# Patient Record
Sex: Female | Born: 1955 | Race: White | Hispanic: No | Marital: Married
Health system: Southern US, Community
[De-identification: ages and names within clinical notes are randomized; demographics above are authoritative.]

## PROBLEM LIST (undated history)

## (undated) DIAGNOSIS — E119 Type 2 diabetes mellitus without complications: Secondary | ICD-10-CM

---

## 2011-07-10 DIAGNOSIS — E559 Vitamin D deficiency, unspecified: Secondary | ICD-10-CM | POA: Insufficient documentation

## 2011-10-09 DIAGNOSIS — G5600 Carpal tunnel syndrome, unspecified upper limb: Secondary | ICD-10-CM | POA: Insufficient documentation

## 2011-12-03 DIAGNOSIS — R011 Cardiac murmur, unspecified: Secondary | ICD-10-CM | POA: Insufficient documentation

## 2013-02-26 DIAGNOSIS — M25569 Pain in unspecified knee: Secondary | ICD-10-CM | POA: Insufficient documentation

## 2015-09-04 DIAGNOSIS — E782 Mixed hyperlipidemia: Secondary | ICD-10-CM | POA: Insufficient documentation

## 2015-09-04 DIAGNOSIS — I10 Essential (primary) hypertension: Secondary | ICD-10-CM | POA: Insufficient documentation

## 2015-09-07 DIAGNOSIS — G629 Polyneuropathy, unspecified: Secondary | ICD-10-CM | POA: Insufficient documentation

## 2015-09-07 DIAGNOSIS — M059 Rheumatoid arthritis with rheumatoid factor, unspecified: Secondary | ICD-10-CM | POA: Insufficient documentation

## 2015-09-07 DIAGNOSIS — G4733 Obstructive sleep apnea (adult) (pediatric): Secondary | ICD-10-CM | POA: Insufficient documentation

## 2015-09-07 DIAGNOSIS — F17200 Nicotine dependence, unspecified, uncomplicated: Secondary | ICD-10-CM | POA: Insufficient documentation

## 2015-09-07 DIAGNOSIS — J301 Allergic rhinitis due to pollen: Secondary | ICD-10-CM | POA: Insufficient documentation

## 2016-02-26 DIAGNOSIS — E041 Nontoxic single thyroid nodule: Secondary | ICD-10-CM | POA: Insufficient documentation

## 2016-03-18 DIAGNOSIS — M2550 Pain in unspecified joint: Secondary | ICD-10-CM | POA: Insufficient documentation

## 2016-08-05 DIAGNOSIS — M179 Osteoarthritis of knee, unspecified: Secondary | ICD-10-CM | POA: Insufficient documentation

## 2019-11-16 DIAGNOSIS — M5412 Radiculopathy, cervical region: Secondary | ICD-10-CM | POA: Insufficient documentation

## 2019-11-16 DIAGNOSIS — S43429A Sprain of unspecified rotator cuff capsule, initial encounter: Secondary | ICD-10-CM | POA: Insufficient documentation

## 2019-11-16 DIAGNOSIS — M5030 Other cervical disc degeneration, unspecified cervical region: Secondary | ICD-10-CM | POA: Insufficient documentation

## 2019-11-16 DIAGNOSIS — E119 Type 2 diabetes mellitus without complications: Secondary | ICD-10-CM | POA: Insufficient documentation

## 2019-11-19 DIAGNOSIS — N390 Urinary tract infection, site not specified: Secondary | ICD-10-CM | POA: Insufficient documentation

## 2019-12-31 DIAGNOSIS — F4323 Adjustment disorder with mixed anxiety and depressed mood: Secondary | ICD-10-CM | POA: Insufficient documentation

## 2020-02-22 DIAGNOSIS — F325 Major depressive disorder, single episode, in full remission: Secondary | ICD-10-CM | POA: Insufficient documentation

## 2020-04-10 DIAGNOSIS — R8281 Pyuria: Secondary | ICD-10-CM | POA: Insufficient documentation

## 2020-11-11 DIAGNOSIS — M1990 Unspecified osteoarthritis, unspecified site: Secondary | ICD-10-CM | POA: Insufficient documentation

## 2021-01-23 ENCOUNTER — Other Ambulatory Visit: Payer: Self-pay | Admitting: Endocrinology

## 2021-01-23 DIAGNOSIS — Z1231 Encounter for screening mammogram for malignant neoplasm of breast: Secondary | ICD-10-CM

## 2021-03-18 ENCOUNTER — Other Ambulatory Visit: Payer: Self-pay

## 2021-03-18 ENCOUNTER — Ambulatory Visit
Admission: RE | Admit: 2021-03-18 | Discharge: 2021-03-18 | Disposition: A | Discharge status: Home / Self Care | Payer: BLUE CROSS/BLUE SHIELD | Source: Ambulatory Visit | Attending: Endocrinology | Admitting: Endocrinology

## 2021-03-18 DIAGNOSIS — Z1231 Encounter for screening mammogram for malignant neoplasm of breast: Secondary | ICD-10-CM

## 2021-03-18 HISTORY — DX: Type 2 diabetes mellitus without complications: E11.9

## 2021-05-21 IMAGING — MG MM DIGITAL SCREENING BILAT W/ TOMO AND CAD
6 of 12 series · 6 of 36 positions shown · non-contrast
Comparison: None.

CLINICAL DATA: Screening.

EXAM:
DIGITAL SCREENING BILATERAL MAMMOGRAM WITH TOMOSYNTHESIS AND CAD
TECHNIQUE: Bilateral screening digital craniocaudal and mediolateral oblique
mammograms were obtained. Bilateral screening digital breast
tomosynthesis was performed. The images were evaluated with
computer-aided detection.

[L MLO synth-2D]
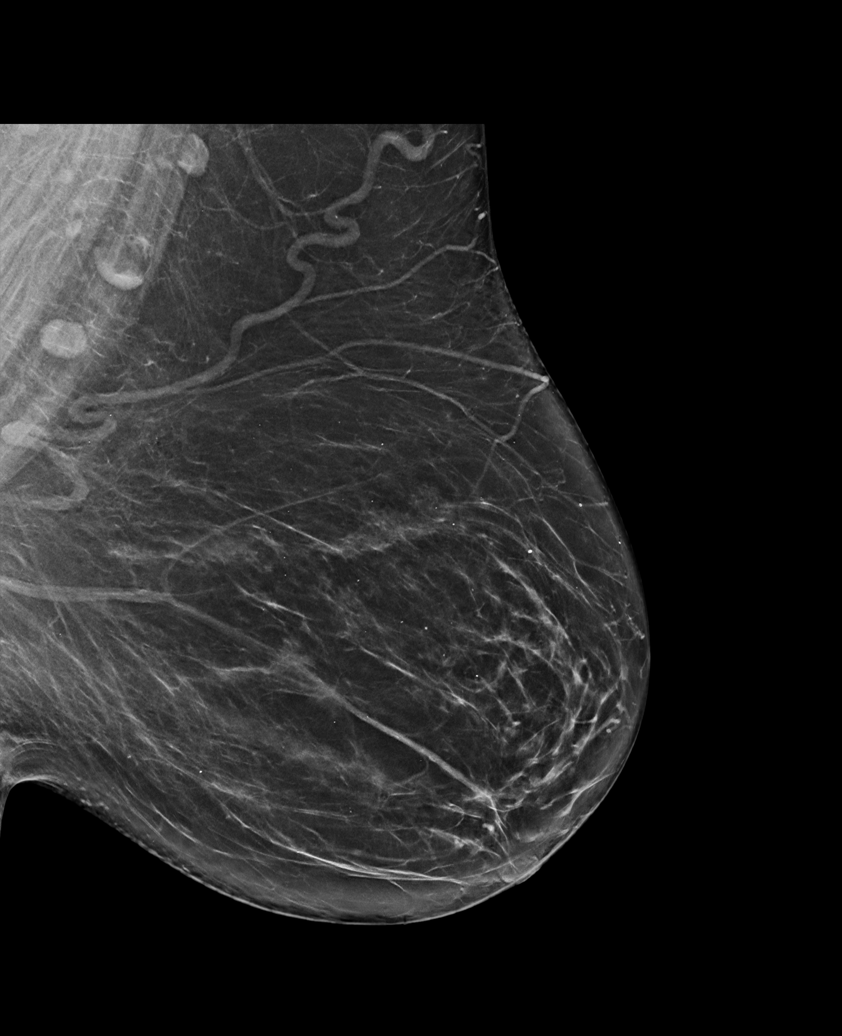

[R MLO synth-2D]
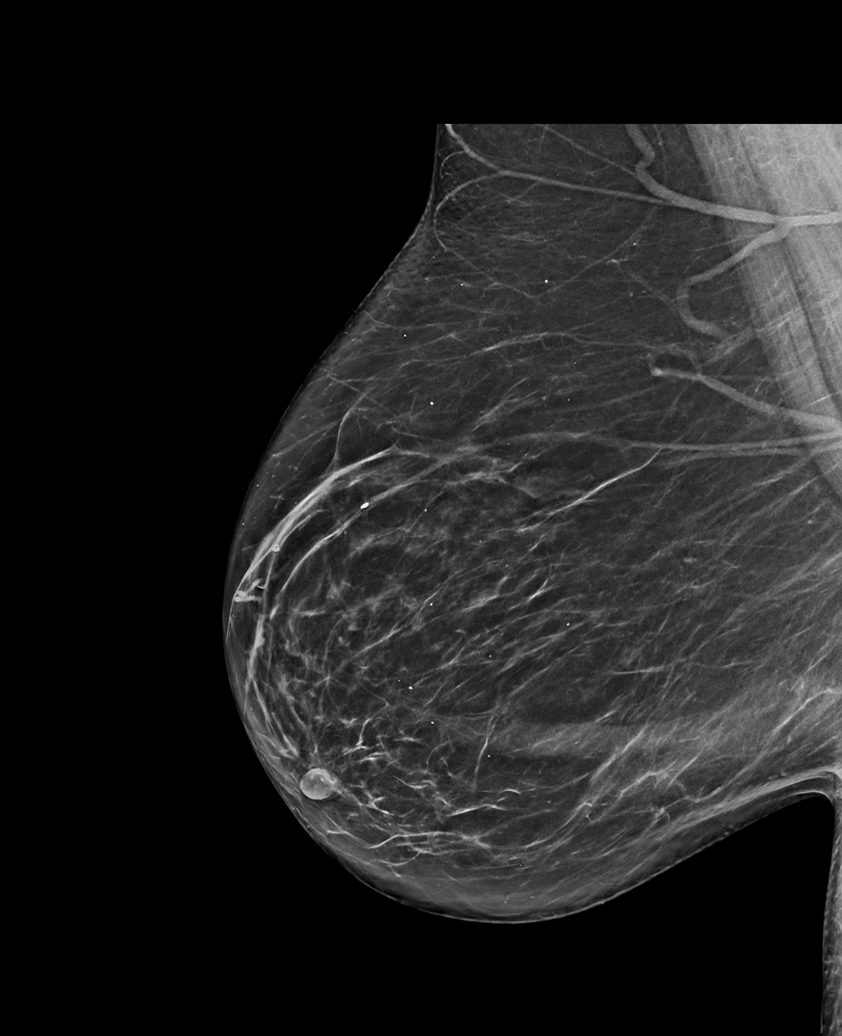

[R CC synth-2D (1 of 2)]
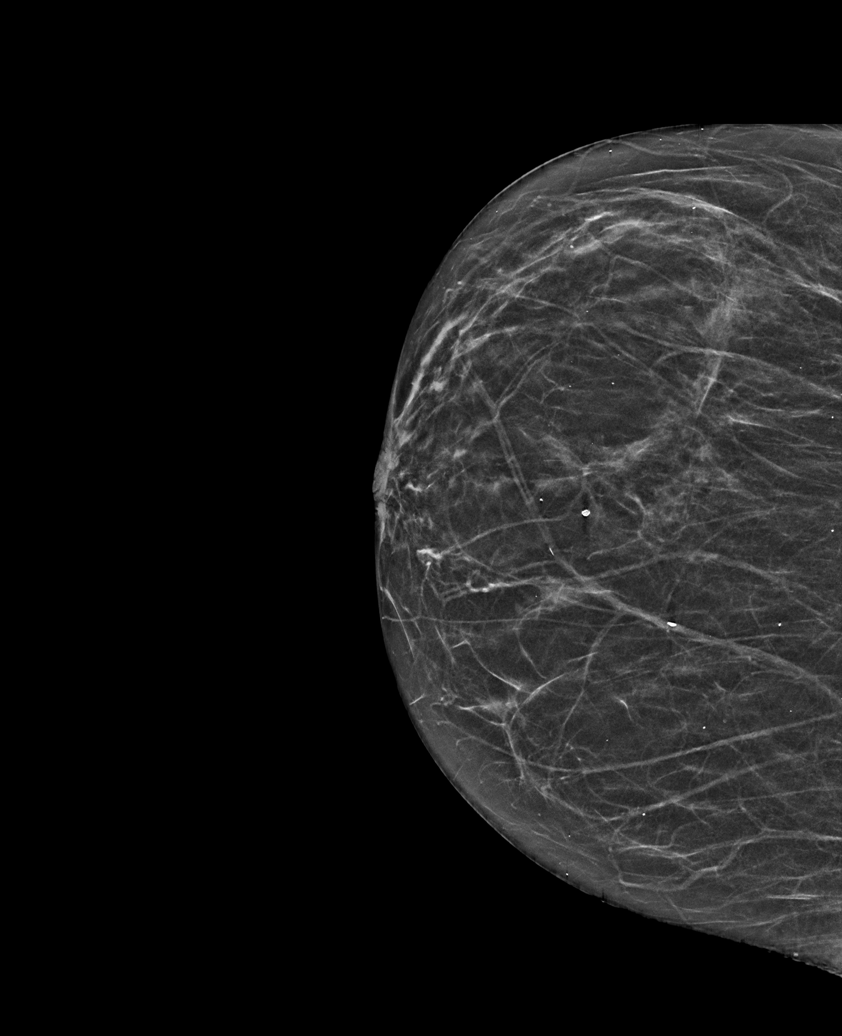

[L CC synth-2D (1 of 2)]
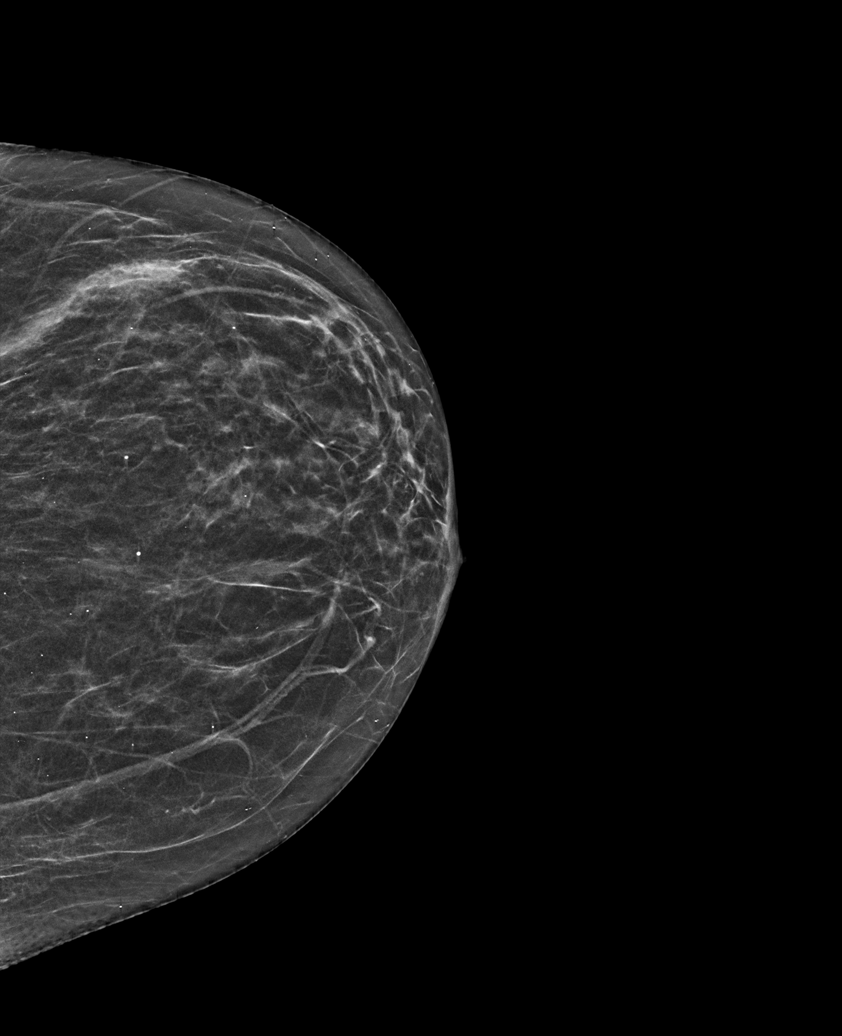

[R CC synth-2D (2 of 2)]
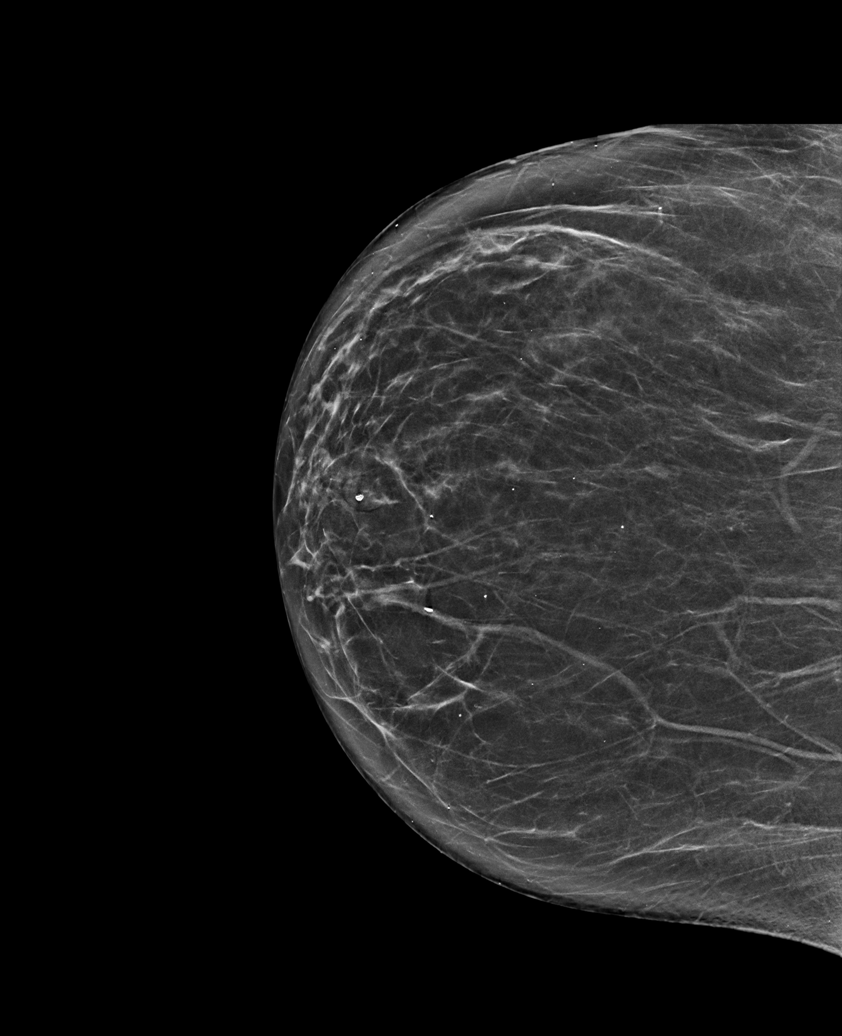

[L CC synth-2D (2 of 2)]
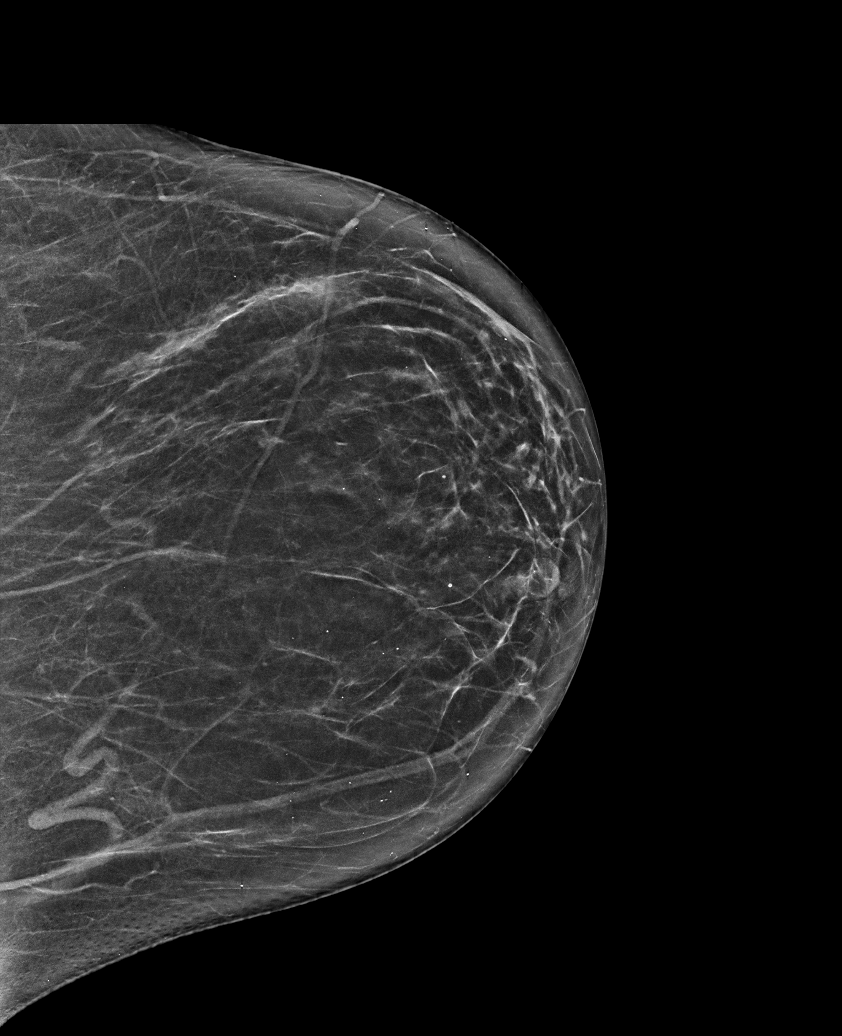

[6 of 36 positions shown; findings below may reference images not displayed]

ACR Breast Density Category b: There are scattered areas of
fibroglandular density.
FINDINGS: There are no findings suspicious for malignancy. The images were
evaluated with computer-aided detection.
IMPRESSION: No mammographic evidence of malignancy. A result letter of this
screening mammogram will be mailed directly to the patient.

RECOMMENDATION:
Screening mammogram in one year. (Code:C7-6-ASJ)

BI-RADS CATEGORY  1: Negative.

## 2022-01-20 ENCOUNTER — Ambulatory Visit (INDEPENDENT_AMBULATORY_CARE_PROVIDER_SITE_OTHER): Payer: BLUE CROSS/BLUE SHIELD | Admitting: Podiatry

## 2022-01-20 ENCOUNTER — Other Ambulatory Visit: Payer: Self-pay

## 2022-01-20 ENCOUNTER — Encounter: Payer: Self-pay | Admitting: Podiatry

## 2022-01-20 DIAGNOSIS — M79675 Pain in left toe(s): Secondary | ICD-10-CM

## 2022-01-20 DIAGNOSIS — S8000XA Contusion of unspecified knee, initial encounter: Secondary | ICD-10-CM | POA: Insufficient documentation

## 2022-01-20 DIAGNOSIS — M79674 Pain in right toe(s): Secondary | ICD-10-CM

## 2022-01-20 DIAGNOSIS — M542 Cervicalgia: Secondary | ICD-10-CM | POA: Insufficient documentation

## 2022-01-20 DIAGNOSIS — E119 Type 2 diabetes mellitus without complications: Secondary | ICD-10-CM | POA: Diagnosis not present

## 2022-01-20 DIAGNOSIS — B351 Tinea unguium: Secondary | ICD-10-CM | POA: Diagnosis not present

## 2022-01-20 NOTE — Progress Notes (Signed)
?  Subjective:  ?Patient ID: Tiffany Rivas, female    DOB: August 21, 1956,   MRN: [id number] ? ?Chief Complaint  ?Patient presents with  ? Diabetes  ?   (New Patient) Diagnosis: Diabetic Foot Exam Referring Provider: Newton Pigg LEE   ? ? ?66 y.o. female presents for diabetic foot exam and concern of thickened elongated and painful nails that are difficult to trim. Requesting to have them trimmed today. Denies any burning or tingling in her feet. Patient is diabetic and last A1c was 6.0.  ? . Denies any other pedal complaints. Denies n/v/f/c.  ? ?PCP Newton Pigg FNP ? ?Past Medical History:  ?Diagnosis Date  ? Diabetes mellitus without complication (HCC)   ? ? ?Objective:  ?Physical Exam: ?Vascular: DP/PT pulses 2/4 bilateral. CFT <3 seconds. Absent hair growth on digits. Edema noted to bilateral lower extremities. Xerosis noted bilaterally.  ?Skin. No lacerations or abrasions bilateral feet. Nails 1-5 bilateral  are thickened discolored and elongated with subungual debris.  ?Musculoskeletal: MMT 5/5 bilateral lower extremities in DF, PF, Inversion and Eversion. Deceased ROM in DF of ankle joint.  ?Neurological: Sensation intact to light touch. Protective sensation diminished bilateral.  ? ? ?Assessment:  ? ?1. Controlled type 2 diabetes mellitus without complication, unspecified whether long term insulin use (HCC)   ? ? ? ?Plan:  ?Patient was evaluated and treated and all questions answered. ?-Discussed and educated patient on diabetic foot care, especially with  ?regards to the vascular, neurological and musculoskeletal systems.  ?-Stressed the importance of good glycemic control and the detriment of not  ?controlling glucose levels in relation to the foot. ?-Discussed supportive shoes at all times and checking feet regularly.  ?-Mechanically debrided all nails 1-5 bilateral using sterile nail nipper and filed with dremel without incident  ?-Answered all patient questions ?-Patient to return  in 3  months for at risk foot care ?-Patient advised to call the office if any problems or questions arise in the meantime. ? ? ?Louann Sjogren, DPM  ? ? ?

## 2022-04-28 ENCOUNTER — Ambulatory Visit: Payer: BLUE CROSS/BLUE SHIELD | Admitting: Podiatry
